# Patient Record
Sex: Male | Born: 1970 | Race: Black or African American | Hispanic: No | Marital: Single | Smoking: Never smoker
Health system: Southern US, Community
[De-identification: ages and names within clinical notes are randomized; demographics above are authoritative.]

## PROBLEM LIST (undated history)

## (undated) DIAGNOSIS — I10 Essential (primary) hypertension: Secondary | ICD-10-CM

## (undated) DIAGNOSIS — J45909 Unspecified asthma, uncomplicated: Secondary | ICD-10-CM

---

## 2007-02-04 ENCOUNTER — Emergency Department (HOSPITAL_COMMUNITY): Admission: EM | Admit: 2007-02-04 | Discharge: 2007-02-04 | Payer: Self-pay | Admitting: Emergency Medicine

## 2011-07-14 ENCOUNTER — Emergency Department (HOSPITAL_COMMUNITY): Payer: Self-pay

## 2011-07-14 ENCOUNTER — Inpatient Hospital Stay (HOSPITAL_COMMUNITY)
Admission: EM | Admit: 2011-07-14 | Discharge: 2011-07-18 | DRG: 603 | Disposition: A | Discharge status: Home / Self Care | Payer: Self-pay | Attending: Internal Medicine | Admitting: Internal Medicine

## 2011-07-14 DIAGNOSIS — F1210 Cannabis abuse, uncomplicated: Secondary | ICD-10-CM | POA: Diagnosis present

## 2011-07-14 DIAGNOSIS — L02619 Cutaneous abscess of unspecified foot: Principal | ICD-10-CM | POA: Diagnosis present

## 2011-07-14 DIAGNOSIS — L03039 Cellulitis of unspecified toe: Secondary | ICD-10-CM | POA: Diagnosis present

## 2011-07-14 DIAGNOSIS — I10 Essential (primary) hypertension: Secondary | ICD-10-CM | POA: Diagnosis present

## 2011-07-14 DIAGNOSIS — L03119 Cellulitis of unspecified part of limb: Principal | ICD-10-CM | POA: Diagnosis present

## 2011-07-14 LAB — DIFFERENTIAL
Basophils Absolute: 0.1 10*3/uL (ref 0.0–0.1)
Basophils Relative: 1 % (ref 0–1)
Lymphocytes Relative: 32 % (ref 12–46)
Monocytes Absolute: 0.6 10*3/uL (ref 0.1–1.0)
Monocytes Relative: 7 % (ref 3–12)
Neutro Abs: 5 10*3/uL (ref 1.7–7.7)
Neutrophils Relative %: 55 % (ref 43–77)

## 2011-07-14 LAB — POCT I-STAT, CHEM 8
BUN: 13 mg/dL (ref 6–23)
Creatinine, Ser: 1.4 mg/dL — ABNORMAL HIGH (ref 0.50–1.35)
Glucose, Bld: 102 mg/dL — ABNORMAL HIGH (ref 70–99)
Hemoglobin: 13.9 g/dL (ref 13.0–17.0)
Potassium: 3.5 mEq/L (ref 3.5–5.1)
Sodium: 141 mEq/L (ref 135–145)

## 2011-07-14 LAB — CBC
HCT: 38.1 % — ABNORMAL LOW (ref 39.0–52.0)
Hemoglobin: 13.5 g/dL (ref 13.0–17.0)
MCHC: 35.4 g/dL (ref 30.0–36.0)
RBC: 4.32 MIL/uL (ref 4.22–5.81)

## 2011-07-16 LAB — COMPREHENSIVE METABOLIC PANEL
Albumin: 3.7 g/dL (ref 3.5–5.2)
Alkaline Phosphatase: 64 U/L (ref 39–117)
BUN: 9 mg/dL (ref 6–23)
CO2: 28 mEq/L (ref 19–32)
Chloride: 105 mEq/L (ref 96–112)
Creatinine, Ser: 1.17 mg/dL (ref 0.50–1.35)
GFR calc Af Amer: >60 mL/min (ref >60)
GFR calc non Af Amer: >60 mL/min (ref >60)
Glucose, Bld: 130 mg/dL — ABNORMAL HIGH (ref 70–99)
Potassium: 3.7 mEq/L (ref 3.5–5.1)
Total Bilirubin: 0.3 mg/dL (ref 0.3–1.2)

## 2011-07-16 LAB — CBC
HCT: 37 % — ABNORMAL LOW (ref 39.0–52.0)
Hemoglobin: 13.1 g/dL (ref 13.0–17.0)
MCH: 31.1 pg (ref 26.0–34.0)
MCHC: 35.4 g/dL (ref 30.0–36.0)
MCV: 87.9 fL (ref 78.0–100.0)
RBC: 4.21 MIL/uL — ABNORMAL LOW (ref 4.22–5.81)

## 2011-07-16 LAB — PROTIME-INR: Prothrombin Time: 14.6 seconds (ref 11.6–15.2)

## 2011-07-16 LAB — SEDIMENTATION RATE: Sed Rate: 11 mm/hr (ref 0–16)

## 2011-07-17 LAB — VANCOMYCIN, TROUGH: Vancomycin Tr: 7.1 ug/mL — ABNORMAL LOW (ref 10.0–20.0)

## 2011-07-22 NOTE — Discharge Summary (Signed)
  NAMEMarland Kitchen  Randall, Watts NO.:  [id number]  MEDICAL RECORD NO.:  [id number]  LOCATION:  5008                         FACILITY:  [facility]  PHYSICIAN:  Isidor Holts, M.D.  DATE OF BIRTH:  1971/10/27  DATE OF ADMISSION:  07/14/2011 DATE OF DISCHARGE:  07/18/2011                              DISCHARGE SUMMARY   PRIMARY MD:  None.  DISCHARGE DIAGNOSES: 1. Left second toe paronychia/secondary cellulitis. 2. Hypertension, new diagnosis.  DISCHARGE MEDICATIONS: 1. Augmentin 875 mg p.o. b.i.d. for 7 days. 2. Doxycycline 100 mg p.o. b.i.d. for 7 days. 3. Fluconazole 100 mg p.o. daily for 7 days. 4. Hydrocodone/APAP (5/325) 1 p.o. p.r.n. q.6 hourly.  A total of 42     pills have been dispensed. 5. Lisinopril 20 mg p.o. daily.  PROCEDURES:  X-ray, left foot July 14, 2011.  This showed minimal cortical irregularity of the left great toe.  This may suggest osteomyelitis given the current symptoms.  CONSULTATIONS:  Lenn Sink, DPM, podiatrist  ADMISSION HISTORY:  As in H and P notes of July 14, 2011, dictated by Dr. Marguerite Olea.  However, in brief, this is a 40 year old male, with no significant past medical history, who presents with progressive swelling, pain left second toe and left great toe, spreading along the dorsum of his left foot, of approximately 10 days duration.  He was admitted for further evaluation, investigation, and management.  CLINICAL COURSE: 1. Paronychia, left second toe with secondary cellulitis.  The patient     presents as described above.  X-ray demonstrated irregularity of     the tuft of left great toe, which was minimal, raising suspicion of     possible osteomyelitis.  The patient was placed on a combination of     vancomycin, Zosyn, and Diflucan with improvement in local     inflammatory phenomena.  Podiatry consultation was kindly provided     by Dr. Cristie Hem, who did a debridement on July 17, 2011, and     recommended  dressings, oral antibiotics, and outpatient followup.  2. Hypertension.  The patient's blood pressure was found to have     persistently elevated during the course of this hospitalization.     He is therefore being placed on lisinopril 20 mg p.o. daily.  DISPOSITION:  The patient was on July 18, 2011, feeling considerably better.  Local inflammatory phenomena of the left foot has dramatically subsided.  He was therefore deemed clinically stable for discharge and was discharged accordingly.  ACTIVITY:  As tolerated.  DIET:  Heart healthy.  FOLLOWUP INSTRUCTIONS:  Outpatient appointment will be arranged by [facility] as the patient does not have a PMD.  In addition, he is to follow up with Dr. Cristie Hem, podiatrist in the coming week. Telephone number [phone].  The patient has been instructed to call for an appointment and has verbalized understanding.     Isidor Holts, M.D.     CO/MEDQ  D:  07/18/2011  T:  07/19/2011  Job:  [id number]  cc:   Lenn Sink, D.P.M.  Electronically Signed by Isidor Holts M.D. on 07/22/2011 06:35:04 PM

## 2011-08-07 NOTE — H&P (Signed)
  NAME:  Randall Watts, Randall Watts NO.:  [id number]  MEDICAL RECORD NO.:  [id number]  LOCATION:  [facility]                         FACILITY:  [facility]  PHYSICIAN:  Houston Siren, MD           DATE OF BIRTH:  25-Jan-1971  DATE OF ADMISSION:  07/14/2011 DATE OF DISCHARGE:                             HISTORY & PHYSICAL   PRIMARY CARE PHYSICIAN:  None.  REASON FOR ADMISSION:  Cellulitis and paronychia.  ADVANCE DIRECTIVE:  Full code.  HISTORY OF PRESENT ILLNESS:  A 40 year old male with benign past medical history on no chronic medication, presented to emergency room with swelling and pain of his left great toe and left second toe spreading along his left foot for the past 10 days.  He has no systemic complaints, but does have a temperature of 99 and a normal white count. His creatinine is 1.4 and his blood sugar is 102.  He has no fever or chills.  No rash, nausea, vomiting or any others symptomatology.  X-ray of his toe showed question of osteo changes of the distal toes suggestive of osteomyelitis with recommendation for clinical correlation.  He was started on vancomycin.  Hospitalist was asked to admit the patient.  PAST MEDICAL HISTORY:  Benign.  MEDICATIONS:  No chronic meds.  FAMILY HISTORY:  Diabetes.  SOCIAL HISTORY:  He is unemployed, occasional drinker, does not smoke but uses marijuana.  He has 2 children, was never married.  Previously, he was a Paediatric nurse.  PHYSICAL EXAMINATION:  VITAL SIGNS:  120/80, pulse of 73, respiratory rate of 18, temperature 99.0. HEENT:  Unremarkable.  Throat is clear. NECK:  Supple.  No lymphadenopathy. CARDIAC:  S1 and S2 regular.  I did not hear any murmur, rub or gallop. LUNGS:  Clear. ABDOMEN:  Obese, nondistended, nontender. EXTREMITIES:  No calf tenderness.  Good distal pulses bilaterally. SKIN:  Warm and dry.  He has paronychia of the great toe and small paronychia of the second toe with surrounded erythema and  swelling.  LABORATORY DATA:  Study as above.  IMPRESSION:  Paronychia with secondary cellulitis.  We will admit him. Give pain medication and IV vancomycin.  He is stable otherwise, and I am not convinced that he has osteomyelitis.  We will see how he responds to antibiotics.     Houston Siren, MD     PL/MEDQ  D:  07/14/2011  T:  07/14/2011  Job:  [id number]  Electronically Signed by Houston Siren  on 08/07/2011 10:05:43 PM

## 2013-04-29 ENCOUNTER — Encounter (HOSPITAL_COMMUNITY): Payer: Self-pay | Admitting: Emergency Medicine

## 2013-04-29 ENCOUNTER — Emergency Department (HOSPITAL_COMMUNITY)
Admission: EM | Admit: 2013-04-29 | Discharge: 2013-04-29 | Disposition: A | Discharge status: Home / Self Care | Payer: Self-pay | Attending: Emergency Medicine | Admitting: Emergency Medicine

## 2013-04-29 ENCOUNTER — Emergency Department (HOSPITAL_COMMUNITY): Payer: Self-pay

## 2013-04-29 DIAGNOSIS — Y929 Unspecified place or not applicable: Secondary | ICD-10-CM | POA: Insufficient documentation

## 2013-04-29 DIAGNOSIS — S63259A Unspecified dislocation of unspecified finger, initial encounter: Secondary | ICD-10-CM

## 2013-04-29 DIAGNOSIS — X58XXXA Exposure to other specified factors, initial encounter: Secondary | ICD-10-CM | POA: Insufficient documentation

## 2013-04-29 DIAGNOSIS — Y939 Activity, unspecified: Secondary | ICD-10-CM | POA: Insufficient documentation

## 2013-04-29 DIAGNOSIS — S63279A Dislocation of unspecified interphalangeal joint of unspecified finger, initial encounter: Secondary | ICD-10-CM | POA: Insufficient documentation

## 2013-04-29 MED ORDER — OXYCODONE-ACETAMINOPHEN 5-325 MG PO TABS
1.0000 | ORAL_TABLET | Freq: Once | ORAL | Status: AC
  Administered 2013-04-29: 1 via ORAL
  Filled 2013-04-29: qty 1

## 2013-04-29 MED ORDER — HYDROCODONE-ACETAMINOPHEN 5-325 MG PO TABS
1.0000 | ORAL_TABLET | ORAL | Status: DC | PRN
Start: 2013-04-29 — End: 2013-08-11

## 2013-04-29 NOTE — ED Notes (Signed)
[id number]<XB> Expected date:<BR> Expected time:<BR> Means of arrival:<BR> Comments:<BR> Roxan Hockey

## 2013-04-29 NOTE — ED Provider Notes (Signed)
History     CSN: [id number]  Arrival date & time 04/29/13  0140   None     Chief Complaint  Patient presents with  . Finger Injury    (Consider location/radiation/quality/duration/timing/severity/associated sxs/prior treatment) HPI History provided by pt.   Patient's left middle finger was closed in a door ~2 hours ago.  Has severe pain w/ associated swelling and limited mobility.  Denies paresthesias.  Has not taken anything for pain.  History reviewed. No pertinent past medical history.  History reviewed. No pertinent past surgical history.  History reviewed. No pertinent family history.  History  Substance Use Topics  . Smoking status: Not on file  . Smokeless tobacco: Not on file  . Alcohol Use: Not on file      Review of Systems  All other systems reviewed and are negative.    Allergies  Review of patient's allergies indicates no known allergies.  Home Medications   Current Outpatient Rx  Name  Route  Sig  Dispense  Refill  . HYDROcodone-acetaminophen (NORCO/VICODIN) 5-325 MG per tablet   Oral   Take 1 tablet by mouth every 4 (four) hours as needed for pain.   8 tablet   0     BP 164/111  Pulse 63  Temp(Src) 98.4 F (36.9 C) (Oral)  Resp 18  SpO2 98%  Physical Exam  Nursing note and vitals reviewed. Constitutional: He is oriented to person, place, and time. He appears well-developed and well-nourished. No distress.  HENT:  Head: Normocephalic and atraumatic.  Eyes:  Normal appearance  Neck: Normal range of motion.  Pulmonary/Chest: Effort normal.  Musculoskeletal: Normal range of motion.  Deformity at PIP joint of L middle finger w/ mild edema and severe ttp.  Pt unable to actively flex.  Distal sensation intact and brisk cap refill.    Neurological: He is alert and oriented to person, place, and time.  Psychiatric: He has a normal mood and affect. His behavior is normal.    ED Course  ORTHOPEDIC INJURY TREATMENT Date/Time: 04/29/2013  12:14 PM Performed by: Ruby Cola E Authorized by: Ruby Cola E Injury location: finger Location details: left long finger Pre-procedure neurovascular assessment: neurovascularly intact Pre-procedure distal perfusion: normal Pre-procedure neurological function: normal Local anesthesia used: no Patient sedated: no Immobilization: tape Post-procedure neurovascular assessment: post-procedure neurovascularly intact Post-procedure distal perfusion: normal Post-procedure neurological function: normal Post-procedure range of motion: improved Patient tolerance: Patient tolerated the procedure well with no immediate complications. Comments: Manual reduction of PIP joint dislocation   (including critical care time)  Labs Reviewed - No data to display Dg Finger Middle Left  04/29/2013   *RADIOLOGY REPORT*  Clinical Data: Finger injury  LEFT MIDDLE FINGER 2+V  Comparison: None.  Findings: Dislocation of the third digit at the PIP joint with dorsal displacement of the distal component.  No fracture is identified.  IMPRESSION: Dorsal dislocation at the third digit PIP joint.   Original Report Authenticated By: Jearld Lesch, M.D.     1. Finger dislocation, initial encounter       MDM  42yo M presents w/ dislocatoin of L middle PIP joint.  Reduced manually w/out anesthesia.  Pain improved.  Pt received 1 percocet in ED and fingers buddy-taped.  D/c'd home w/ 8 vicodin.          Otilio Miu, PA-C 04/29/13 1216

## 2013-04-29 NOTE — ED Notes (Signed)
Pt presents to ED with c/o pain and swelling to left middle finger.  Pt reports that left middle finger got "smashed" with a closing heavy door 2 hours ago.

## 2013-04-30 NOTE — ED Provider Notes (Signed)
Medical screening examination/treatment/procedure(s) were performed by non-physician practitioner and as supervising physician I was immediately available for consultation/collaboration.  Finger DISLOCATION with REDUCTION  Sunnie Nielsen, MD 04/30/13 [phone]

## 2013-08-11 ENCOUNTER — Emergency Department (HOSPITAL_COMMUNITY)
Admission: EM | Admit: 2013-08-11 | Discharge: 2013-08-11 | Disposition: A | Discharge status: Home / Self Care | Payer: No Typology Code available for payment source | Attending: Emergency Medicine | Admitting: Emergency Medicine

## 2013-08-11 ENCOUNTER — Emergency Department (HOSPITAL_COMMUNITY): Payer: No Typology Code available for payment source

## 2013-08-11 ENCOUNTER — Encounter (HOSPITAL_COMMUNITY): Payer: Self-pay | Admitting: Emergency Medicine

## 2013-08-11 DIAGNOSIS — J45909 Unspecified asthma, uncomplicated: Secondary | ICD-10-CM | POA: Insufficient documentation

## 2013-08-11 DIAGNOSIS — S0990XA Unspecified injury of head, initial encounter: Secondary | ICD-10-CM | POA: Insufficient documentation

## 2013-08-11 DIAGNOSIS — M542 Cervicalgia: Secondary | ICD-10-CM

## 2013-08-11 DIAGNOSIS — Y9389 Activity, other specified: Secondary | ICD-10-CM | POA: Insufficient documentation

## 2013-08-11 DIAGNOSIS — Y92410 Unspecified street and highway as the place of occurrence of the external cause: Secondary | ICD-10-CM | POA: Insufficient documentation

## 2013-08-11 DIAGNOSIS — H53149 Visual discomfort, unspecified: Secondary | ICD-10-CM | POA: Insufficient documentation

## 2013-08-11 DIAGNOSIS — M549 Dorsalgia, unspecified: Secondary | ICD-10-CM

## 2013-08-11 DIAGNOSIS — S0993XA Unspecified injury of face, initial encounter: Secondary | ICD-10-CM | POA: Insufficient documentation

## 2013-08-11 DIAGNOSIS — I10 Essential (primary) hypertension: Secondary | ICD-10-CM | POA: Insufficient documentation

## 2013-08-11 DIAGNOSIS — IMO0002 Reserved for concepts with insufficient information to code with codable children: Secondary | ICD-10-CM | POA: Insufficient documentation

## 2013-08-11 HISTORY — DX: Essential (primary) hypertension: I10

## 2013-08-11 HISTORY — DX: Unspecified asthma, uncomplicated: J45.909

## 2013-08-11 MED ORDER — CYCLOBENZAPRINE HCL 5 MG PO TABS: 5.0000 mg | ORAL_TABLET | Freq: Three times a day (TID) | ORAL | Status: DC | PRN

## 2013-08-11 MED ORDER — ACETAMINOPHEN 325 MG PO TABS
650.0000 mg | ORAL_TABLET | Freq: Once | ORAL | Status: AC
  Administered 2013-08-11: 650 mg via ORAL
  Filled 2013-08-11: qty 2

## 2013-08-11 NOTE — ED Notes (Signed)
Waiting on discharge until PA can clarify if patient needs to stay a little longer secondary to elevated BP.
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
## 2014-02-03 ENCOUNTER — Emergency Department (HOSPITAL_COMMUNITY)
Admission: EM | Admit: 2014-02-03 | Discharge: 2014-02-03 | Disposition: A | Discharge status: Home / Self Care | Payer: No Typology Code available for payment source | Attending: Emergency Medicine | Admitting: Emergency Medicine

## 2014-02-03 ENCOUNTER — Encounter (HOSPITAL_COMMUNITY): Payer: Self-pay | Admitting: Emergency Medicine

## 2014-02-03 DIAGNOSIS — R599 Enlarged lymph nodes, unspecified: Secondary | ICD-10-CM | POA: Insufficient documentation

## 2014-02-03 DIAGNOSIS — R509 Fever, unspecified: Secondary | ICD-10-CM | POA: Insufficient documentation

## 2014-02-03 DIAGNOSIS — I10 Essential (primary) hypertension: Secondary | ICD-10-CM | POA: Insufficient documentation

## 2014-02-03 DIAGNOSIS — J45909 Unspecified asthma, uncomplicated: Secondary | ICD-10-CM | POA: Insufficient documentation

## 2014-02-03 DIAGNOSIS — R590 Localized enlarged lymph nodes: Secondary | ICD-10-CM

## 2014-02-03 LAB — URINALYSIS, ROUTINE W REFLEX MICROSCOPIC
Bilirubin Urine: NEGATIVE
GLUCOSE, UA: NEGATIVE mg/dL
Hgb urine dipstick: NEGATIVE
Ketones, ur: NEGATIVE mg/dL
LEUKOCYTES UA: NEGATIVE
Nitrite: NEGATIVE
PH: 7 (ref 5.0–8.0)
PROTEIN: NEGATIVE mg/dL
Specific Gravity, Urine: 1.025 (ref 1.005–1.030)
Urobilinogen, UA: 1 mg/dL (ref 0.0–1.0)

## 2014-02-03 LAB — HIV ANTIBODY (ROUTINE TESTING W REFLEX): HIV: NONREACTIVE

## 2014-02-03 LAB — RPR: RPR: NONREACTIVE

## 2014-02-03 LAB — GC/CHLAMYDIA PROBE AMP
CT Probe RNA: NEGATIVE
GC Probe RNA: NEGATIVE

## 2014-02-03 NOTE — Discharge Instructions (Signed)
Take Ibuprofen and apply cold compresses for pain control Follow-up within 1 week for re-check of your lymph node  Return to the emergency department if you develop any changing/worsening condition, fever, drainage, spreading redness/swelling, difficulty with urination, abdominal pain, vomiting, or any other concerns (please read additional information regarding your condition below)   Lymphadenopathy Lymphadenopathy means "disease of the lymph glands." But the term is usually used to describe swollen or enlarged lymph glands, also called lymph nodes. These are the bean-shaped organs found in many locations including the neck, underarm, and groin. Lymph glands are part of the immune system, which fights infections in your body. Lymphadenopathy can occur in just one area of the body, such as the neck, or it can be generalized, with lymph node enlargement in several areas. The nodes found in the neck are the most common sites of lymphadenopathy. CAUSES  When your immune system responds to germs (such as viruses or bacteria ), infection-fighting cells and fluid build up. This causes the glands to grow in size. This is usually not something to worry about. Sometimes, the glands themselves can become infected and inflamed. This is called lymphadenitis. Enlarged lymph nodes can be caused by many diseases:  Bacterial disease, such as strep throat or a skin infection.  Viral disease, such as a common cold.  Other germs, such as lyme disease, tuberculosis, or sexually transmitted diseases.  Cancers, such as lymphoma (cancer of the lymphatic system) or leukemia (cancer of the white blood cells).  Inflammatory diseases such as lupus or rheumatoid arthritis.  Reactions to medications. Many of the diseases above are rare, but important. This is why you should see your caregiver if you have lymphadenopathy. SYMPTOMS   Swollen, enlarged lumps in the neck, back of the head or other  locations.  Tenderness.  Warmth or redness of the skin over the lymph nodes.  Fever. DIAGNOSIS  Enlarged lymph nodes are often near the source of infection. They can help healthcare providers diagnose your illness. For instance:   Swollen lymph nodes around the jaw might be caused by an infection in the mouth.  Enlarged glands in the neck often signal a throat infection.  Lymph nodes that are swollen in more than one area often indicate an illness caused by a virus. Your caregiver most likely will know what is causing your lymphadenopathy after listening to your history and examining you. Blood tests, x-rays or other tests may be needed. If the cause of the enlarged lymph node cannot be found, and it does not go away by itself, then a biopsy may be needed. Your caregiver will discuss this with you. TREATMENT  Treatment for your enlarged lymph nodes will depend on the cause. Many times the nodes will shrink to normal size by themselves, with no treatment. Antibiotics or other medicines may be needed for infection. Only take over-the-counter or prescription medicines for pain, discomfort or fever as directed by your caregiver. HOME CARE INSTRUCTIONS  Swollen lymph glands usually return to normal when the underlying medical condition goes away. If they persist, contact your health-care provider. He/she might prescribe antibiotics or other treatments, depending on the diagnosis. Take any medications exactly as prescribed. Keep any follow-up appointments made to check on the condition of your enlarged nodes.  SEEK MEDICAL CARE IF:   Swelling lasts for more than two weeks.  You have symptoms such as weight loss, night sweats, fatigue or fever that does not go away.  The lymph nodes are hard, seem fixed to  the skin or are growing rapidly.  Skin over the lymph nodes is red and inflamed. This could mean there is an infection. SEEK IMMEDIATE MEDICAL CARE IF:   Fluid starts leaking from the area  of the enlarged lymph node.  You develop a fever of 102 F (38.9 C) or greater.  Severe pain develops (not necessarily at the site of a large lymph node).  You develop chest pain or shortness of breath.  You develop worsening abdominal pain. MAKE SURE YOU:   Understand these instructions.  Will watch your condition.  Will get help right away if you are not doing well or get worse. Document Released: 09/11/2008 Document Revised: 02/25/2012 Document Reviewed: 09/11/2008 [facility] Patient Information 2014 [place], [place].   Emergency Department Resource Guide 1) Find a Doctor and Pay Out of Pocket Although you won't have to find out who is covered by your insurance plan, it is a good idea to ask around and get recommendations. You will then need to call the office and see if the doctor you have chosen will accept you as a new patient and what types of options they offer for patients who are self-pay. Some doctors offer discounts or will set up payment plans for their patients who do not have insurance, but you will need to ask so you aren't surprised when you get to your appointment.  2) Contact Your Local Health Department Not all health departments have doctors that can see patients for sick visits, but many do, so it is worth a call to see if yours does. If you don't know where your local health department is, you can check in your phone book. The CDC also has a tool to help you locate your state's health department, and many state websites also have listings of all of their local health departments.  3) Find a Walk-in Clinic If your illness is not likely to be very severe or complicated, you may want to try a walk in clinic. These are popping up all over the country in pharmacies, drugstores, and shopping centers. They're usually staffed by nurse practitioners or physician assistants that have been trained to treat common illnesses and complaints. They're usually fairly quick and  inexpensive. However, if you have serious medical issues or chronic medical problems, these are probably not your best option.  No Primary Care Doctor: - Call Health Connect at  [phone] - they can help you locate a primary care doctor that  accepts your insurance, provides certain services, etc. - Physician Referral Service- [phone]  Chronic Pain Problems: Organization         Address  Phone   Notes  [facility]  [phone] Patients need to be referred by their primary care doctor.   Medication Assistance: Organization         Address  Phone   Notes  [facility] [address] [place] [place], [place] [address] [phone] --Must be a resident of [facility] -- Must have NO insurance coverage whatsoever (no Medicaid/ Medicare, etc.) -- The pt. MUST have a primary care doctor that directs their care regularly and follows them in the community   [facility]  [phone]   [employer]  [phone]    Agencies that provide inexpensive medical care: Organization         Address  Phone   Notes  [facility]  [phone]   [facility]    (  [phone]   [facility] [address] [place], [place] [address] [phone]   [facility] [address] [place] [place] [phone]   Planned Parenthood    [phone]   [facility]    [phone]   [facility]  [address], [place] Phone:  [phone], Fax:  [phone] Hours of Operation:  9 am - 6 pm, M-F.  Also accepts Medicaid/Medicare and self-pay.  [facility]  [address], [place] Phone: [phone], Fax: [phone]. Hours of Operation:  8:30 am - 5:30 pm, M-F.  Also accepts Medicaid and self-pay.  [facility] [place] [address] [place] Phone: [phone]   [facility] [address] Natasha Bence [place], [place] [phone], Ext. [phone] Mondays & Thursdays: 7-9 AM.  First 15 patients are seen on a first come, first serve basis.    Medicaid-accepting [facility] Providers:  Organization         Address  Phone   Notes  [facility] [address], [place] [phone] Also accepts self-pay patients.  [facility] [address] Laurell Josephs [place], [place]  [phone]   [facility] [address], [place] [phone]   [facility] [address], [place] [phone]   Renaye Rakers [address], [place]   [phone] Only accepts [place] patients after they have their name applied to their card.   Self-Pay (no insurance) in [facility]:  Organization         Address  Phone   Notes  Sickle Cell Patients, [facility] [address] [place], [place] [phone]   [facility] [address] [place], [place] [phone]   [facility]  [address], [place] [phone]   [facility]/Dr. Osei-Bonsu  [address] [place] or [address], [place] [phone] Phone number for both [place] and [place] locations is the same.  [facility] [address] [place] [phone]   [facility] [address], [place] or [address] [phone]   [facility] [address] [place] [phone], phone; [phone], fax Sees patients 1st and 3rd Saturday of every month.  Must not qualify for public or private insurance (i.e. Medicaid, Medicare, [place] Health Choice, Veterans' Benefits)  Household income should be no more than 200% of the poverty level The clinic cannot treat you if you are pregnant or  think you are pregnant  Sexually transmitted diseases are not treated at the clinic.    Dental Care: Organization         Address  Phone  Notes  [facility] [address] [place], [place] [phone] Accepts children up to age 15 who are enrolled in [place] or [place] Health Choice; pregnant women with a Medicaid card; and children who have applied for Medicaid or [place] Health Choice, but were declined, whose parents can pay a reduced fee at time of service.  [facility]  [address], [place] [phone] Accepts children up  to age 84 who are enrolled in Medicaid or [place] Health Choice; pregnant women with a Medicaid card; and children who have applied for Medicaid or  Health Choice, but were declined, whose parents can pay a reduced fee at time of service.  [facility]  [address] [place], [place] [phone] Patients are seen by appointment only. Walk-ins are not accepted. [facility] will see patients 45 years of age and older. Monday - Tuesday (8am-5pm) Most Wednesdays (8:30-5pm) $30 per visit, cash only  [facility] Adult [facility] Access [facility]  [address], [facility] [phone] Patients are seen by appointment only. Walk-ins are not accepted. [facility] will see patients 54 years of age and older. One Wednesday Evening (Monthly: Volunteer Based).  $30 per visit, cash only  [employer]  [phone] for adults; Children under age 25, call Graduate Pediatric Dentistry at [phone]. Children aged 32-14, please call [phone] to request a pediatric application.  Dental services are provided in all areas of dental care including fillings, crowns and bridges, complete and partial dentures, implants, gum treatment, root canals, and extractions. Preventive care is also provided. Treatment is provided to both adults  and children. Patients are selected via a lottery and there is often a waiting list.   [facility] [address] [place]  [phone] [email or web address]   [facility] [address] [place], [place] [phone], Ext. [phone] Second and Fourth Thursday of each month, opens at 6:30 AM; Clinic ends at 9 AM.  Patients are seen on a first-come first-served basis, and a limited number are seen during each clinic.   [facility]  [address] Ether Griffins [place], [place] [phone]   Eligibility Requirements You must have lived in [place], [place], or [place] counties for at least the last three months.   You cannot be eligible for state or federal sponsored [place], including [employer], [place], or [employer].   You generally cannot be eligible for healthcare insurance through your employer.    How to apply: Eligibility screenings are held every Tuesday and Wednesday afternoon from 1:00 pm until 4:00 pm. You do not need an appointment for the interview!  [facility] [address], [place], [place] [phone]   [facility]  [phone]   [facility]  [phone]   [facility]  [phone]    Behavioral Health Resources in the Community: Intensive Outpatient Programs Organization         Address  Phone  Notes  [facility] [address], [place], [place] [phone]   [facility] [address] [place], [place] [phone]   [facility] [address], [place], [place]  [phone]   [facility] [address]  [place], [place] [phone] or [phone]   Substance Abuse Resources Organization         Address  Phone  Notes  [facility] Drug Services  [phone]   [facility]  [phone]   [facility]  [phone]    Floydene Flock  [phone]   Residential & Outpatient Substance Abuse Program  [phone]   Psychological Services Organization         Address  Phone  Notes  [facility] Behavioral Health  [phone]   [place] [facility]  [phone]   [facility]  [address] [place] [phone] or [phone]    Mobile Crisis Teams Organization         Address  Phone  Notes  [facility], Mobile Crisis Care Unit  [phone]   [facility]  [address] [place], [place] [phone]   Doristine Locks [address] [place] [phone]    Self-Help/Support Groups Organization         Address  Phone             Notes  [facility] [place] - variety of support groups  336- I7437963 Call for more information  Narcotics Anonymous (NA), [facility] [address], [employer] [place]  2 meetings at this location   Statistician         Address  Phone  Notes  [facility] [address],    [place]  [phone]   [facility]  [address] [place] [address], [place], [place] [phone]   [facility] [address] [place], [place] [phone] Admissions: 8am-3pm M-F  [facility] [address]    [place], [place] [phone]   [facility] [address] [place], [place], [place] [phone]   [facility] [address]  [place], [place] [phone]   [facility] - Intensive Outpatient [address] Laurell Joseph[address], [place], [place] [phone]   [facility] ([facility] [address] [place]  [place], [place] [phone] or [phone]   [facility] ([facility]) [address] [place], [place] [phone] Accepts Medicaid  [facility] [address],  [place] [phone] Substance Abuse/Addiction Treatment   [facility] Organization         Address  Phone  Notes  [facility]  [phone]   Angie Fava, PhD [address] Ervin Knack [place], [place]   [phone] or [phone]   [facility] Behavioral   [address] [place], [place] [phone]   [facility] [address], [place], [place] [phone] Insurance/Medicaid/sponsorship through [facility] and Families [address]                                    [place], [place] [phone] Therapy/tele-psych/case  [facility] [address], [place] [phone]    Dr. Lolly Mustache  [phone]   [facility]  [employer] [facility] Dept. 1) [address], [place] 2) [address], [place] 3)  [address], [place] [phone]  [phone]   [facility] Child Abuse Hotline [phone] or 732-259-1950 (After Hours)

## 2014-02-03 NOTE — ED Notes (Signed)
PA at bedside.

## 2014-02-03 NOTE — ED Notes (Signed)
MD at bedside. 

## 2014-02-03 NOTE — ED Provider Notes (Signed)
CSN: [id number]     Arrival date & time 02/03/14  [id number] History   None    Chief Complaint  Patient presents with  . Recurrent Skin Infections    HPI  Randall Watts is a 43 y.o. male with a PMH of HTN and asthma who presents to the ED for evaluation of recurrent skin infections.  History was provided by the patient. Patient states that he developed progressively worsening right groin pain 6-7 days ago. He denies any significant pain but has pain with pressure/palpation. He states the mass has been growing larger over the past few days. He has had subjective fever, chills but did not take his temperature. He denies any dysuria, penile discharge, genital sores, or testicular pain. Patient is sexually active with no recent partners. No hx of STD. Has not been checked for STD's in over a year. No abdominal pain, constipation, or diarrhea. He otherwise has been feeling well. No change in appetite/activity, rashes.    Past Medical History  Diagnosis Date  . Hypertension   . Asthma    History reviewed. No pertinent past surgical history. History reviewed. No pertinent family history. History  Substance Use Topics  . Smoking status: Never Smoker   . Smokeless tobacco: Not on file  . Alcohol Use: Yes    Review of Systems  Constitutional: Positive for fever (subjective) and chills. Negative for diaphoresis, activity change, appetite change and fatigue.  Respiratory: Negative for shortness of breath.   Cardiovascular: Negative for chest pain.  Gastrointestinal: Negative for nausea, vomiting, abdominal pain, diarrhea and constipation.  Genitourinary: Negative for dysuria, urgency, hematuria, decreased urine volume, discharge, penile swelling, scrotal swelling, difficulty urinating, genital sores, penile pain and testicular pain.  Musculoskeletal: Negative for back pain and myalgias.  Skin: Negative for color change and wound.  Neurological: Negative for weakness, numbness and headaches.      Allergies  Review of patient's allergies indicates no known allergies.  Home Medications   Current Outpatient Rx  Name  Route  Sig  Dispense  Refill  . Multiple Vitamin (MULTIVITAMIN WITH MINERALS) TABS tablet   Oral   Take 1 tablet by mouth daily.          BP 148/102  Pulse 94  Temp(Src) 98.3 F (36.8 C) (Oral)  Resp 18  Ht 6\' 1"  (1.854 m)  Wt 260 lb (117.935 kg)  BMI 34.31 kg/m2  SpO2 98%  Filed Vitals:   02/03/14 0628 02/03/14 0843  BP: 148/102 151/89  Pulse: 94 79  Temp: 98.3 F (36.8 C) 98.3 F (36.8 C)  TempSrc: Oral Oral  Resp: 18 16  Height: 6\' 1"  (1.854 m)   Weight: 260 lb (117.935 kg)   SpO2: 98% 96%    Physical Exam  Nursing note and vitals reviewed. Constitutional: He is oriented to person, place, and time. He appears well-developed and well-nourished. No distress.  HENT:  Head: Normocephalic and atraumatic.  Right Ear: External ear normal.  Left Ear: External ear normal.  Mouth/Throat: Oropharynx is clear and moist.  Eyes: Conjunctivae are normal. Right eye exhibits no discharge. Left eye exhibits no discharge.  Neck: Normal range of motion. Neck supple.  Cardiovascular: Normal rate, regular rhythm, normal heart sounds and intact distal pulses.  Exam reveals no gallop and no friction rub.   No murmur heard. Pulmonary/Chest: Effort normal and breath sounds normal. No respiratory distress. He has no wheezes. He has no rales. He exhibits no tenderness.  Abdominal: Soft. He exhibits no distension  and no mass. There is no tenderness. There is no rebound and no guarding.  Genitourinary: Penis normal.     2 cm x 1 cm firm mobile mass in the right groin which is tender to palpation. No overlying fluctuance, erythema, edema, or wounds/lacerations. No palpable mass on the left groin. No inguinal hernia bilaterally. No testicular tenderness bilaterally. No penile discharge or genital sores.   Musculoskeletal: Normal range of motion. He exhibits no  edema and no tenderness.  Neurological: He is alert and oriented to person, place, and time.  Skin: Skin is warm and dry. He is not diaphoretic.    ED Course  Procedures (including critical care time) Labs Review Labs Reviewed - No data to display Imaging Review No results found.  EKG Interpretation   None      Results for orders placed during the hospital encounter of 02/03/14  GC/CHLAMYDIA PROBE AMP      Result Value Ref Range   CT Probe RNA NEGATIVE  NEGATIVE   GC Probe RNA NEGATIVE  NEGATIVE  URINALYSIS, ROUTINE W REFLEX MICROSCOPIC      Result Value Ref Range   Color, Urine YELLOW  YELLOW   APPearance CLEAR  CLEAR   Specific Gravity, Urine 1.025  1.005 - 1.030   pH 7.0  5.0 - 8.0   Glucose, UA NEGATIVE  NEGATIVE mg/dL   Hgb urine dipstick NEGATIVE  NEGATIVE   Bilirubin Urine NEGATIVE  NEGATIVE   Ketones, ur NEGATIVE  NEGATIVE mg/dL   Protein, ur NEGATIVE  NEGATIVE mg/dL   Urobilinogen, UA 1.0  0.0 - 1.0 mg/dL   Nitrite NEGATIVE  NEGATIVE   Leukocytes, UA NEGATIVE  NEGATIVE  RPR      Result Value Ref Range   RPR NON REACTIVE  NON REACTIVE  HIV ANTIBODY (ROUTINE TESTING)      Result Value Ref Range   HIV NON REACTIVE  NON REACTIVE    MDM   Randall Watts is a 43 y.o. male with a PMH of HTN and asthma who presents to the ED for evaluation of recurrent skin infections.    Rechecks  8:05 AM = Patient resting comfortably. Gonorrhea/chlamydia swab done at bedside with RN present. Waiting on urine sample. Patient drinking water.      Etiology of groin pain possibly due to LAD. No evidence of abscess or cellulitis. No UTI. Patient afebrile and non-toxic in appearance. Patient tested for syphilis, HIV, gonorrhea and chlamydia. Instructed to follow-up with PCP for re-check. BP mildly elevated. No hx of HTN. Instructed to follow-up with PCP. Not on any anti-hypertensive medications. Return precautions, discharge instructions, and follow-up was discussed with the  patient before discharge.     Final impressions: 1. Inguinal lymphadenopathy      Luiz Iron PA-C   This patient was discussed with Dr. Vinetta Bergamo, PA-C 02/03/14 1857

## 2014-02-03 NOTE — ED Notes (Signed)
Pt states he has an abscess to his right groin area  Pt states it started last week progressively gotten worse

## 2014-02-03 NOTE — ED Notes (Signed)
Pt complains of a possible boil on his right groin area

## 2014-02-04 NOTE — ED Provider Notes (Signed)
  This was a shared visit with a mid-level provided (NP or PA).  Throughout the patient's course I was available for consultation/collaboration.  I saw the relevant labs and studies - I agree with the interpretation.  On my exam the patient was in no distress.  There was a probable, tender mass in the right inguinal canal, not consistent with hernia, more consistent with lymphadenopathy.  Absent fever, distress, and was reassuring labs, studies, the patient was appropriate for discharge with outpatient followup.      Gerhard Munchobert Seleni Meller, MD 02/04/14 [phone]
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
